# Patient Record
Sex: Male | Born: 1970 | Race: White | Hispanic: Yes | Marital: Single | Smoking: Never smoker
Health system: Southern US, Community
[De-identification: ages and names within clinical notes are randomized; demographics above are authoritative.]

---

## 2019-11-12 ENCOUNTER — Emergency Department (HOSPITAL_BASED_OUTPATIENT_CLINIC_OR_DEPARTMENT_OTHER)
Admission: EM | Admit: 2019-11-12 | Discharge: 2019-11-12 | Disposition: A | Discharge status: Home / Self Care | Payer: 59 | Attending: Emergency Medicine | Admitting: Emergency Medicine

## 2019-11-12 ENCOUNTER — Other Ambulatory Visit: Payer: Self-pay

## 2019-11-12 ENCOUNTER — Encounter (HOSPITAL_BASED_OUTPATIENT_CLINIC_OR_DEPARTMENT_OTHER): Payer: Self-pay

## 2019-11-12 ENCOUNTER — Emergency Department (HOSPITAL_BASED_OUTPATIENT_CLINIC_OR_DEPARTMENT_OTHER): Payer: 59

## 2019-11-12 DIAGNOSIS — G510 Bell's palsy: Secondary | ICD-10-CM | POA: Diagnosis not present

## 2019-11-12 DIAGNOSIS — R29810 Facial weakness: Secondary | ICD-10-CM | POA: Diagnosis present

## 2019-11-12 LAB — CBC WITH DIFFERENTIAL/PLATELET
Abs Immature Granulocytes: 0.01 K/uL (ref 0.00–0.07)
Basophils Absolute: 0 K/uL (ref 0.0–0.1)
Basophils Relative: 0 %
Eosinophils Absolute: 0.1 K/uL (ref 0.0–0.5)
Eosinophils Relative: 2 %
HCT: 44 % (ref 39.0–52.0)
Hemoglobin: 16.1 g/dL (ref 13.0–17.0)
Immature Granulocytes: 0 %
Lymphocytes Relative: 35 %
Lymphs Abs: 1.7 K/uL (ref 0.7–4.0)
MCH: 33.2 pg (ref 26.0–34.0)
MCHC: 36.6 g/dL — ABNORMAL HIGH (ref 30.0–36.0)
MCV: 90.7 fL (ref 80.0–100.0)
Monocytes Absolute: 0.7 K/uL (ref 0.1–1.0)
Monocytes Relative: 15 %
Neutro Abs: 2.3 K/uL (ref 1.7–7.7)
Neutrophils Relative %: 48 %
Platelets: 183 K/uL (ref 150–400)
RBC: 4.85 MIL/uL (ref 4.22–5.81)
RDW: 11.7 % (ref 11.5–15.5)
WBC: 4.9 K/uL (ref 4.0–10.5)
nRBC: 0 % (ref 0.0–0.2)

## 2019-11-12 LAB — COMPREHENSIVE METABOLIC PANEL WITH GFR
ALT: 72 U/L — ABNORMAL HIGH (ref 0–44)
AST: 42 U/L — ABNORMAL HIGH (ref 15–41)
Albumin: 4 g/dL (ref 3.5–5.0)
Alkaline Phosphatase: 96 U/L (ref 38–126)
Anion gap: 10 (ref 5–15)
BUN: 14 mg/dL (ref 6–20)
CO2: 24 mmol/L (ref 22–32)
Calcium: 8.7 mg/dL — ABNORMAL LOW (ref 8.9–10.3)
Chloride: 102 mmol/L (ref 98–111)
Creatinine, Ser: 0.8 mg/dL (ref 0.61–1.24)
GFR calc Af Amer: >60 mL/min
GFR calc non Af Amer: >60 mL/min
Glucose, Bld: 111 mg/dL — ABNORMAL HIGH (ref 70–99)
Potassium: 3.9 mmol/L (ref 3.5–5.1)
Sodium: 136 mmol/L (ref 135–145)
Total Bilirubin: 0.8 mg/dL (ref 0.3–1.2)
Total Protein: 7.6 g/dL (ref 6.5–8.1)

## 2019-11-12 LAB — CBG MONITORING, ED: Glucose-Capillary: 104 mg/dL — ABNORMAL HIGH (ref 70–99)

## 2019-11-12 MED ORDER — HYPROMELLOSE (GONIOSCOPIC) 2.5 % OP SOLN
1.0000 [drp] | Freq: Four times a day (QID) | OPHTHALMIC | 12 refills | Status: AC | PRN
Start: 2019-11-12 — End: ?

## 2019-11-12 MED ORDER — PREDNISONE 20 MG PO TABS
60.0000 mg | ORAL_TABLET | Freq: Every day | ORAL | 0 refills | Status: AC
Start: 2019-11-12 — End: 2019-11-19

## 2019-11-12 MED ORDER — PREDNISONE 50 MG PO TABS
60.0000 mg | ORAL_TABLET | Freq: Once | ORAL | Status: AC
  Administered 2019-11-12: 60 mg via ORAL
  Filled 2019-11-12: qty 1

## 2019-11-12 NOTE — ED Triage Notes (Signed)
Pt c/o R side facial droop when he awoke this AM. Pt unable to smile on R side and unable to blink on the R side. Pt denies any extremity weakness EDP at bedside.   Pt's last known normal was 01:00 AM. Pt had 2nd COVID vaccine on 5/11.

## 2019-11-12 NOTE — ED Provider Notes (Signed)
[facility] EMERGENCY DEPARTMENT Provider Note   CSN: [id number] Arrival date & time: 11/12/19  1712     History Chief Complaint  Patient presents with  . Facial Droop    Stephen Goodman is a 49 y.o. male.  HPI     49yo male with no significant medical history presents with concern for right facial droop.  Woke up this morning with drooping of the right face, difficulty closing the right eye, increased tearing of the right eye, mild headache.  No numbness/weakness of other sites, no change in vision, no difficulty finding words. Reports due to right facial droop having some difficulty speaking but no other problems swallowing or word finding.  No change in hearing. No fevers, no nausea/vomiting, no difficulty walking.   Had second COVID vax Tuesday No tick exposures, no other recent illness  Spanish interpreter used  History reviewed. No pertinent past medical history.  There are no problems to display for this patient.   History reviewed. No pertinent surgical history.     No family history on file.  Social History   Tobacco Use  . Smoking status: Never Smoker  . Smokeless tobacco: Never Used  Substance Use Topics  . Alcohol use: Never  . Drug use: Never    Home Medications Prior to Admission medications   Medication Sig Start Date End Date Taking? Authorizing Provider  hydroxypropyl methylcellulose / hypromellose (ISOPTO TEARS / GONIOVISC) 2.5 % ophthalmic solution Place 1 drop into the right eye 4 (four) times daily as needed for dry eyes. 11/12/19   Alvira Monday, MD  predniSONE (DELTASONE) 20 MG tablet Take 3 tablets (60 mg total) by mouth daily for 7 days. 11/12/19 11/19/19  Alvira Monday, MD    Allergies    Patient has no known allergies.  Review of Systems   Review of Systems  Constitutional: Negative for fatigue and fever (had on Tues night after vax but resolved).  HENT: Negative for sore throat.   Eyes: Negative for visual  disturbance.  Respiratory: Negative for cough and shortness of breath.   Cardiovascular: Negative for chest pain.  Gastrointestinal: Negative for abdominal pain, nausea and vomiting.  Genitourinary: Negative for difficulty urinating.  Musculoskeletal: Negative for back pain and neck stiffness.  Skin: Negative for rash.  Neurological: Positive for facial asymmetry and headaches. Negative for dizziness, tremors, seizures, syncope, speech difficulty, weakness and numbness.    Physical Exam Updated Vital Signs BP 114/78 (BP Location: Left Arm)   Pulse 74   Resp 16   Wt 64.3 kg   SpO2 100%   Physical Exam Constitutional:      General: He is not in acute distress.    Appearance: Normal appearance. He is not ill-appearing.  HENT:     Head: Normocephalic and atraumatic.  Eyes:     General: No visual field deficit.    Extraocular Movements: Extraocular movements intact.     Conjunctiva/sclera: Conjunctivae normal.     Pupils: Pupils are equal, round, and reactive to light.  Cardiovascular:     Rate and Rhythm: Normal rate and regular rhythm.     Pulses: Normal pulses.     Heart sounds: Normal heart sounds. No murmur. No friction rub. No gallop.   Pulmonary:     Effort: Pulmonary effort is normal. No respiratory distress.     Breath sounds: Normal breath sounds. No wheezing.  Musculoskeletal:        General: No swelling or tenderness.     Cervical back:  Normal range of motion.  Skin:    General: Skin is warm and dry.     Findings: No erythema or rash.  Neurological:     General: No focal deficit present.     Mental Status: He is alert and oriented to person, place, and time.     GCS: GCS eye subscore is 4. GCS verbal subscore is 5. GCS motor subscore is 6.     Cranial Nerves: Facial asymmetry (right facial droop including weakness of lid closure and significant weakness of right forehead) present. No cranial nerve deficit or dysarthria.     Sensory: Sensation is intact. No  sensory deficit.     Motor: Motor function is intact. No weakness or tremor.     Coordination: Coordination is intact. Coordination normal. Finger-Nose-Finger Test normal.     Gait: Gait normal.     ED Results / Procedures / Treatments   Labs (all labs ordered are listed, but only abnormal results are displayed) Labs Reviewed  CBC WITH DIFFERENTIAL/PLATELET - Abnormal; Notable for the following components:      Result Value   MCHC 36.6 (*)    All other components within normal limits  COMPREHENSIVE METABOLIC PANEL - Abnormal; Notable for the following components:   Glucose, Bld 111 (*)    Calcium 8.7 (*)    AST 42 (*)    ALT 72 (*)    All other components within normal limits  CBG MONITORING, ED - Abnormal; Notable for the following components:   Glucose-Capillary 104 (*)    All other components within normal limits    EKG EKG Interpretation  Date/Time:  Thursday Nov 12 2019 17:21:03 EDT Ventricular Rate:  80 PR Interval:    QRS Duration: 96 QT Interval:  367 QTC Calculation: 424 R Axis:   -103 Text Interpretation: Sinus rhythm Normal ECG No old tracing to compare Confirmed by Susy Frizzle [phone]) on 11/13/2019 9:21:36 AM   Radiology CT Head Wo Contrast  Result Date: 11/12/2019 CLINICAL DATA:  Headache, right facial droop. Additional provided: Patient reports right-sided facial droop upon awakening this morning. Last known normal 1 a.m. last night, received second COVID vaccine 5/11 EXAM: CT HEAD WITHOUT CONTRAST TECHNIQUE: Contiguous axial images were obtained from the base of the skull through the vertex without intravenous contrast. COMPARISON:  No pertinent prior studies available for comparison. FINDINGS: Brain: There is no acute intracranial hemorrhage. No demarcated cortical infarct. No extra-axial fluid collection. No evidence of intracranial mass. No midline shift. Vascular: No hyperdense vessel Skull: Normal. Negative for fracture or focal lesion.  Sinuses/Orbits: Visualized orbits show no acute finding. Minimal ethmoid sinus mucosal thickening. Partially imaged left maxillary sinus mucous retention cyst. No significant mastoid effusion. IMPRESSION: No CT evidence of acute intracranial abnormality. Mild ethmoid sinus mucosal thickening. Left maxillary sinus mucous retention cyst. Electronically Signed   By: Jackey Loge DO   On: 11/12/2019 18:27    Procedures Procedures (including critical care time)  Medications Ordered in ED Medications  predniSONE (DELTASONE) tablet 60 mg (60 mg Oral Given 11/12/19 2023)    ED Course  I have reviewed the triage vital signs and the nursing notes.  Pertinent labs & imaging results that were available during my care of the patient were reviewed by me and considered in my medical decision making (see chart for details).    MDM Rules/Calculators/A&P                      49yo male  with no significant medical history presents with concern for right facial droop.  Exam with right facial droop including right forehead weakness consistent with Bells Palsy.  CT head done given presence of headache without acute abnormalities.  No other neurologic abnormalities on exam with exception of facial droop.  No sign of HSV or hx of tick bites.  Timing of symptoms suspicious for relation to COVID 19 vaccine.  Will given rx for prednisone and artifical tears, recommend PCP follow up and discussed reasons to return.  Final Clinical Impression(s) / ED Diagnoses Final diagnoses:  Bell's palsy    Rx / DC Orders ED Discharge Orders         Ordered    predniSONE (DELTASONE) 20 MG tablet  Daily     11/12/19 2002    hydroxypropyl methylcellulose / hypromellose (ISOPTO TEARS / GONIOVISC) 2.5 % ophthalmic solution  4 times daily PRN     11/12/19 Darron Doom, MD 11/13/19 1444

## 2021-03-12 IMAGING — CT CT HEAD W/O CM
3 series · 15 of 47 positions shown, 18 images · non-contrast
Comparison: No pertinent prior studies available for comparison.

CLINICAL DATA: Headache, right facial droop. Additional provided:
Patient reports right-sided facial droop upon awakening this
morning. Last known normal 1 a.m. last night, received second COVID
vaccine [DATE]

EXAM:
CT HEAD WITHOUT CONTRAST
TECHNIQUE: Contiguous axial images were obtained from the base of the skull
through the vertex without intravenous contrast.

[Series 2: head wo · axial · 0.39mm/px · z∈[-86,+38]mm · 9 of 31 slices shown, 12 images]
[im 3/31  brain]
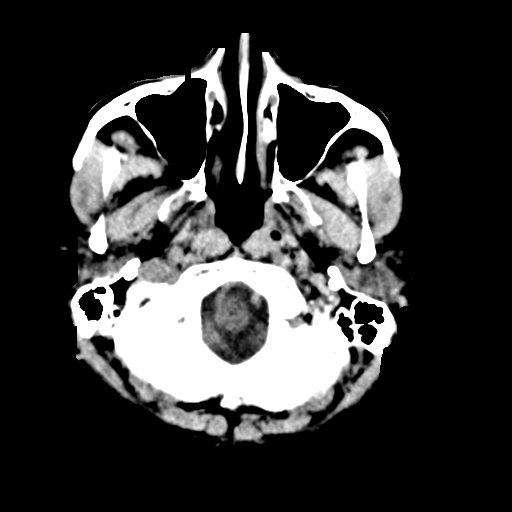
[im 3/31  bone]
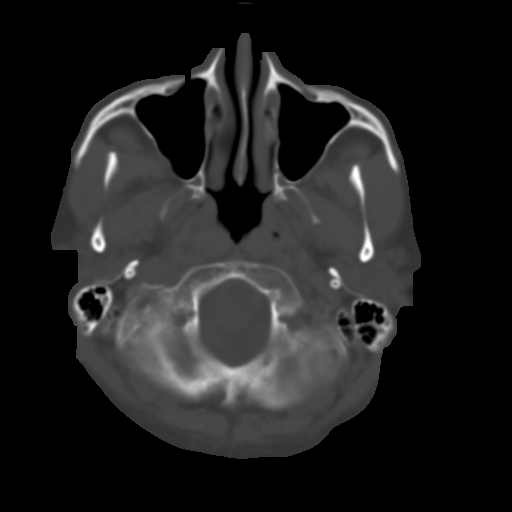
[im 6/31  brain]
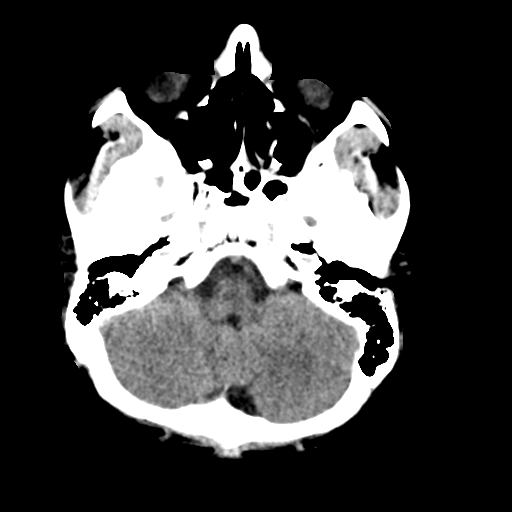
[im 9/31  brain]
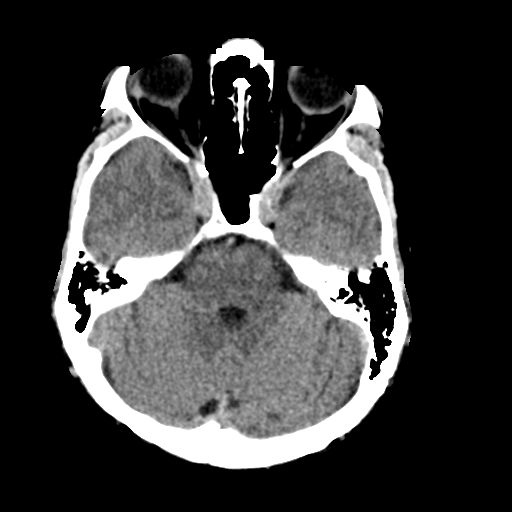
[im 12/31  brain]
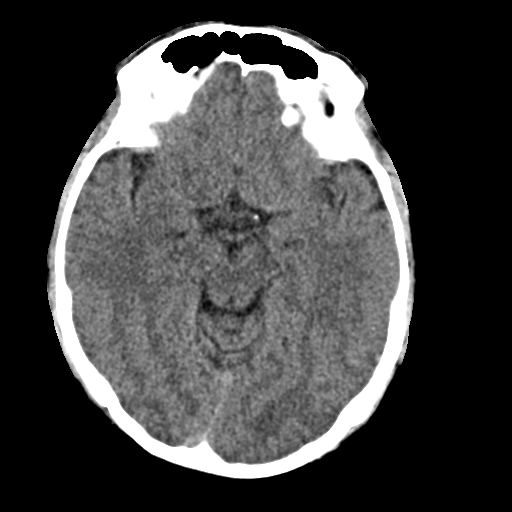
[im 16/31  brain]
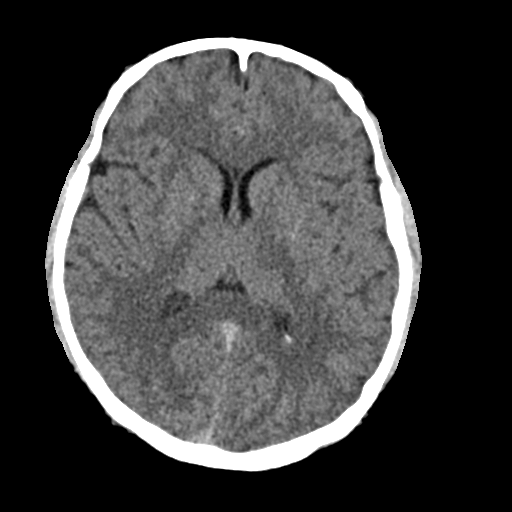
[im 16/31  bone]
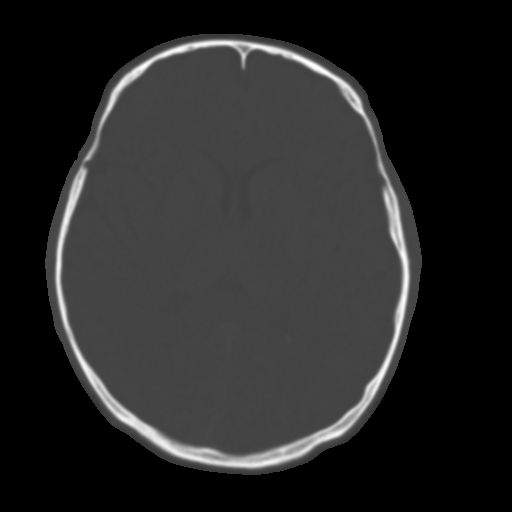
[im 19/31  brain]
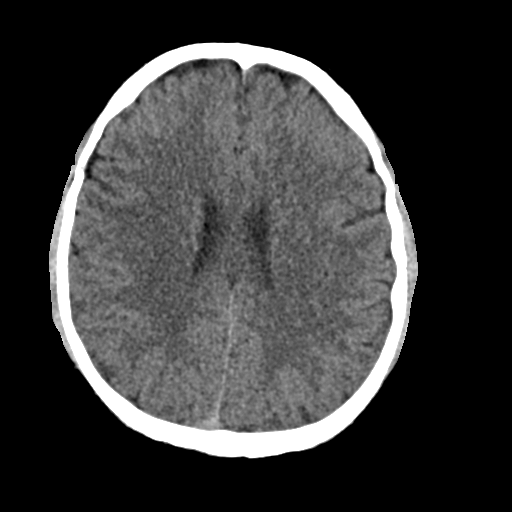
[im 22/31  brain]
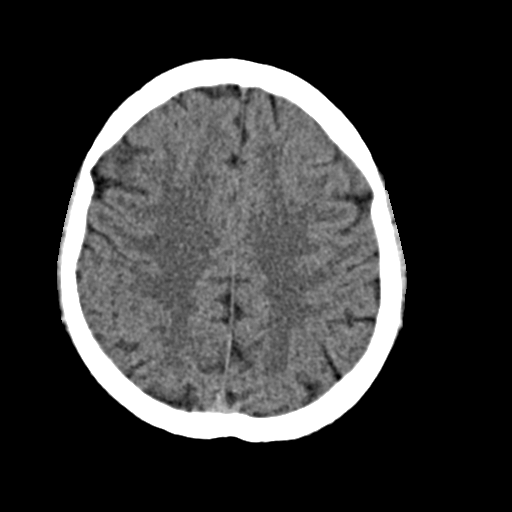
[im 25/31  brain]
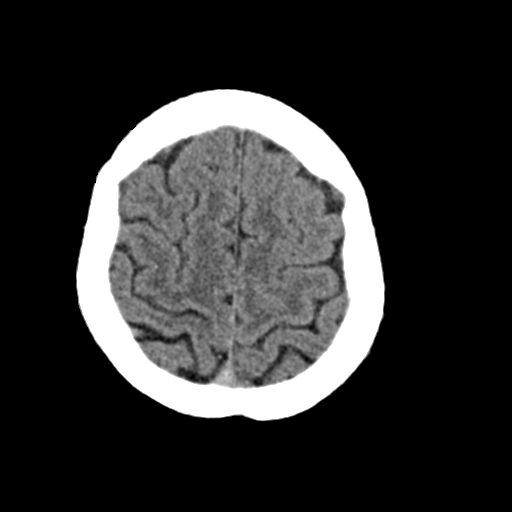
[im 28/31  brain]
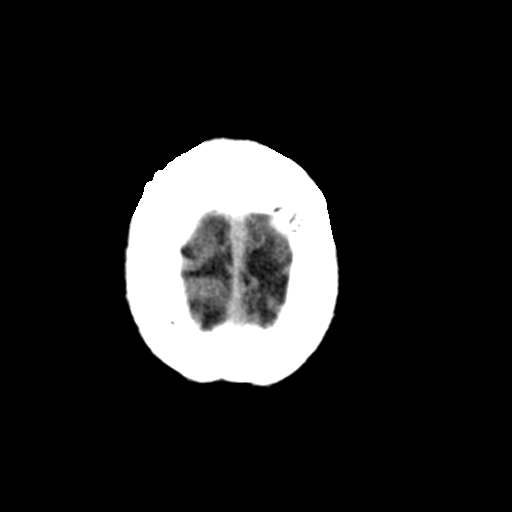
[im 28/31  bone]
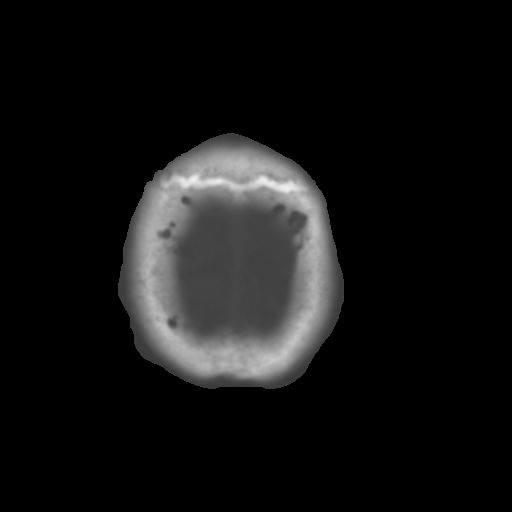

[Series 4: coronal soft · coronal · 0.32mm/px · 3 of 67 slices shown]
[im 23/67  brain]
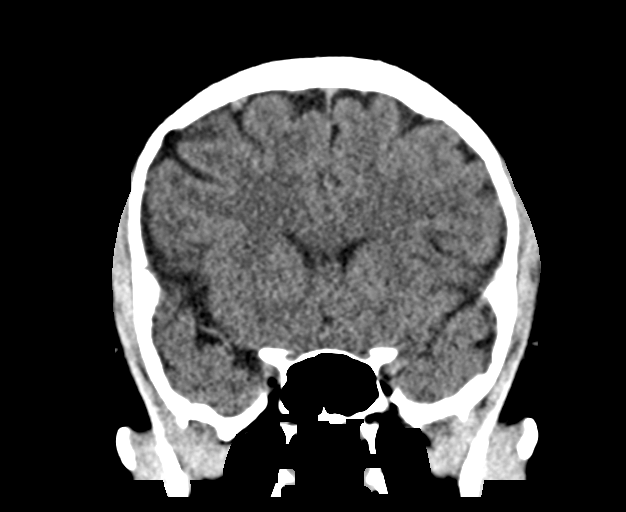
[im 30/67  brain]
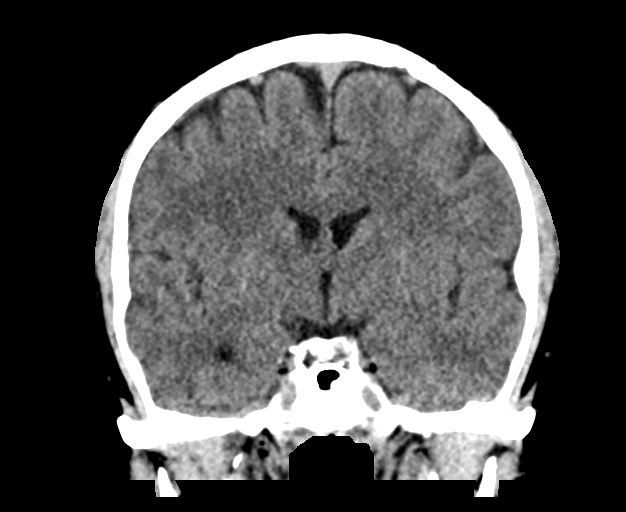
[im 37/67  brain]
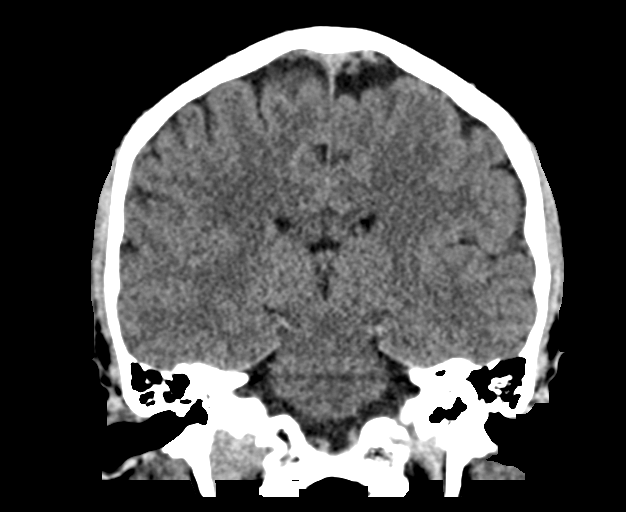

[Series 5: sag soft · sagittal · 0.30mm/px · 3 of 62 slices shown]
[im 21/62  brain]
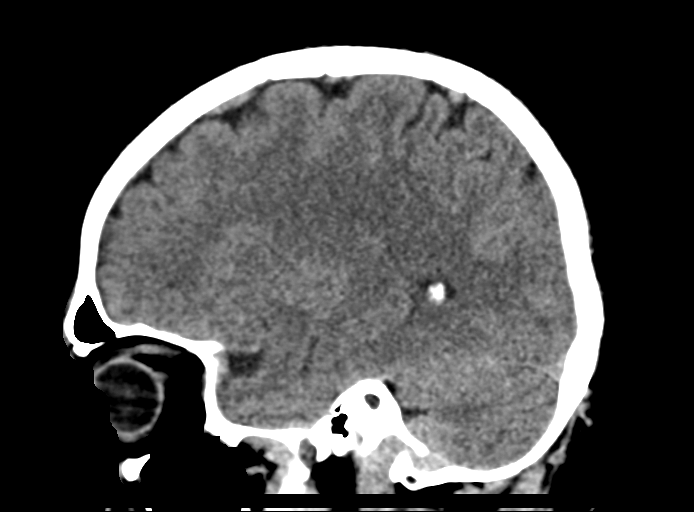
[im 31/62  brain]
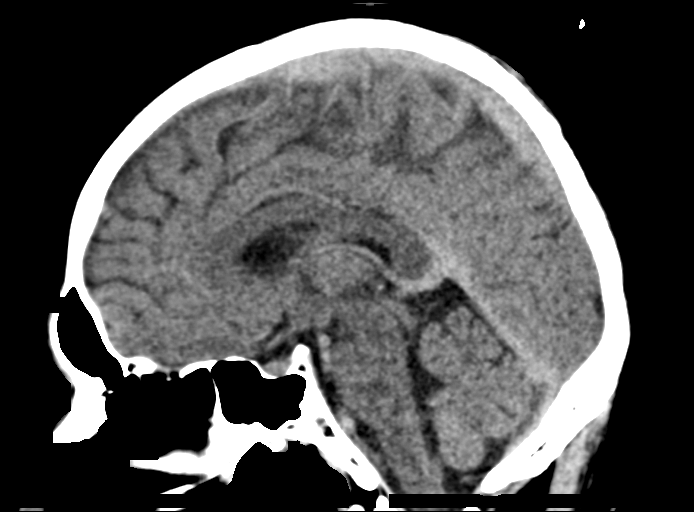
[im 41/62  brain]
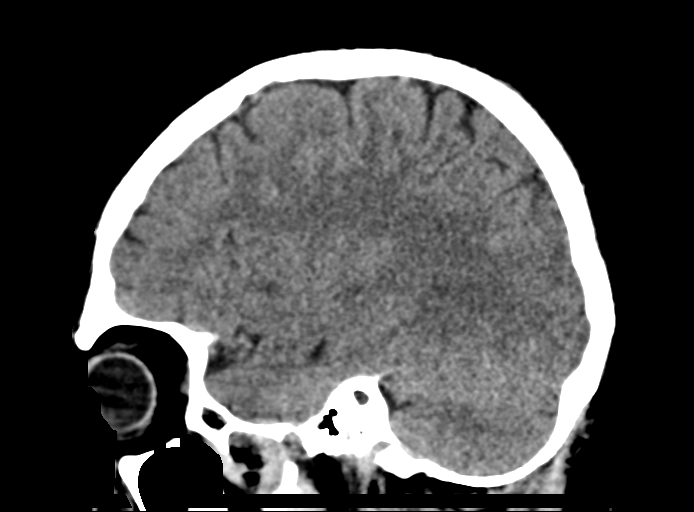

[15 of 47 positions shown; findings below may reference images not displayed]

FINDINGS: Brain:

There is no acute intracranial hemorrhage.

No demarcated cortical infarct.

No extra-axial fluid collection.

No evidence of intracranial mass.

No midline shift.

Vascular: No hyperdense vessel

Skull: Normal. Negative for fracture or focal lesion.

Sinuses/Orbits: Visualized orbits show no acute finding. Minimal
ethmoid sinus mucosal thickening. Partially imaged left maxillary
sinus mucous retention cyst. No significant mastoid effusion.
IMPRESSION: No CT evidence of acute intracranial abnormality.

Mild ethmoid sinus mucosal thickening. Left maxillary sinus mucous
retention cyst.
